# Patient Record
Sex: Female | Born: 1960 | Race: White | Hispanic: No | Marital: Married | Smoking: Never smoker
Health system: Southern US, Community
[De-identification: ages and names within clinical notes are randomized; demographics above are authoritative.]

---

## 2013-07-04 ENCOUNTER — Other Ambulatory Visit: Payer: Self-pay | Admitting: Neurosurgery

## 2013-07-04 DIAGNOSIS — M5416 Radiculopathy, lumbar region: Secondary | ICD-10-CM

## 2013-07-04 DIAGNOSIS — M5412 Radiculopathy, cervical region: Secondary | ICD-10-CM

## 2013-07-07 ENCOUNTER — Ambulatory Visit
Admission: RE | Admit: 2013-07-07 | Discharge: 2013-07-07 | Disposition: A | Discharge status: Home / Self Care | Payer: BC Managed Care – PPO | Source: Ambulatory Visit | Attending: Neurosurgery | Admitting: Neurosurgery

## 2013-07-07 VITALS — BP 117/76 | HR 62

## 2013-07-07 DIAGNOSIS — M5416 Radiculopathy, lumbar region: Secondary | ICD-10-CM

## 2013-07-07 DIAGNOSIS — M5412 Radiculopathy, cervical region: Secondary | ICD-10-CM

## 2013-07-07 MED ORDER — ONDANSETRON HCL 4 MG/2ML IJ SOLN
4.0000 mg | Freq: Once | INTRAMUSCULAR | Status: AC
  Administered 2013-07-07: 4 mg via INTRAMUSCULAR

## 2013-07-07 MED ORDER — ONDANSETRON HCL 4 MG/2ML IJ SOLN: 4.0000 mg | Freq: Four times a day (QID) | INTRAMUSCULAR | Status: DC | PRN

## 2013-07-07 MED ORDER — IOHEXOL 300 MG/ML  SOLN
9.0000 mL | Freq: Once | INTRAMUSCULAR | Status: AC | PRN
  Administered 2013-07-07: 9 mL via INTRATHECAL

## 2013-07-07 MED ORDER — DIAZEPAM 5 MG PO TABS
10.0000 mg | ORAL_TABLET | Freq: Once | ORAL | Status: AC
  Administered 2013-07-07: 10 mg via ORAL

## 2013-07-07 MED ORDER — MEPERIDINE HCL 100 MG/ML IJ SOLN
75.0000 mg | Freq: Once | INTRAMUSCULAR | Status: AC
  Administered 2013-07-07: 75 mg via INTRAMUSCULAR

## 2013-08-07 ENCOUNTER — Other Ambulatory Visit: Payer: Self-pay | Admitting: Physician Assistant

## 2013-08-07 DIAGNOSIS — E041 Nontoxic single thyroid nodule: Secondary | ICD-10-CM

## 2013-08-15 ENCOUNTER — Other Ambulatory Visit (HOSPITAL_COMMUNITY)
Admission: RE | Admit: 2013-08-15 | Discharge: 2013-08-15 | Disposition: A | Discharge status: Home / Self Care | Payer: 59 | Source: Ambulatory Visit | Attending: Interventional Radiology | Admitting: Interventional Radiology

## 2013-08-15 ENCOUNTER — Ambulatory Visit
Admission: RE | Admit: 2013-08-15 | Discharge: 2013-08-15 | Disposition: A | Discharge status: Home / Self Care | Payer: BC Managed Care – PPO | Source: Ambulatory Visit | Attending: Physician Assistant | Admitting: Physician Assistant

## 2013-08-15 ENCOUNTER — Ambulatory Visit
Admission: RE | Admit: 2013-08-15 | Discharge: 2013-08-15 | Disposition: A | Discharge status: Home / Self Care | Payer: 59 | Source: Ambulatory Visit | Attending: Physician Assistant | Admitting: Physician Assistant

## 2013-08-15 DIAGNOSIS — E041 Nontoxic single thyroid nodule: Secondary | ICD-10-CM | POA: Insufficient documentation

## 2015-11-13 IMAGING — CR DG MYELOGRAM 2+ REGIONS
13 of 20 series · 13 of 20 positions shown · non-contrast
Comparison: none

CLINICAL DATA: Spondylosis without myelopathy. Left leg pain. Right
shoulder and arm pain.
TECHNIQUE: Contiguous axial images were obtained through the Cervical and
Lumbar spine after the intrathecal infusion of infusion. Coronal and
sagittal reconstructions were obtained of the axial image sets.

[[hospital]]
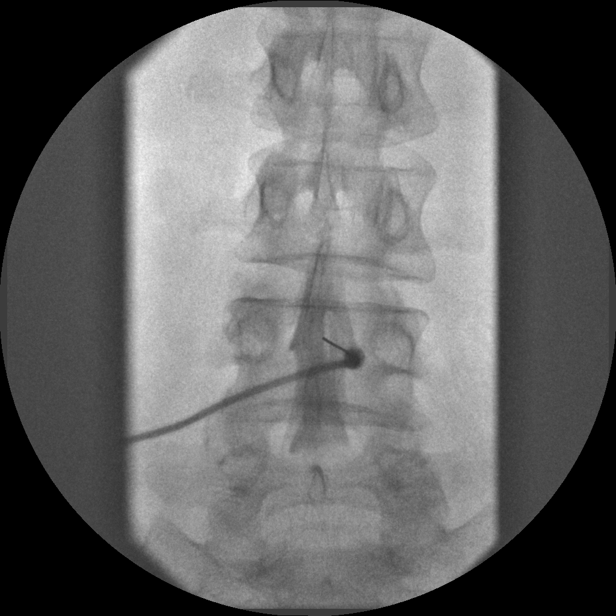

[myelogram  white (1 of 8)]
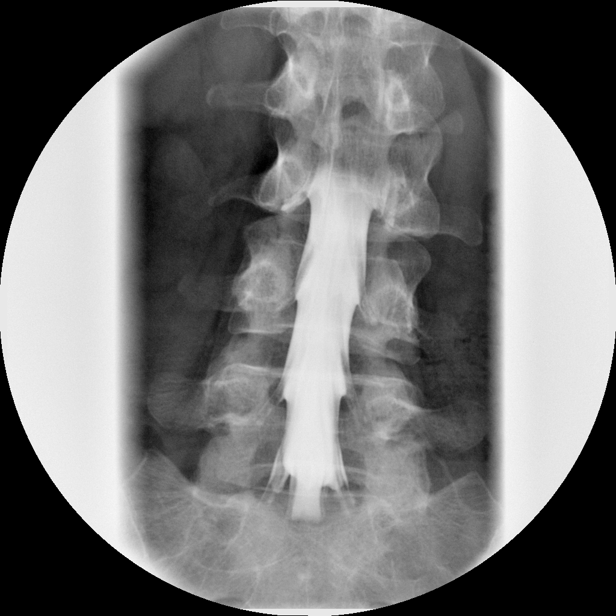

[myelogram  white (2 of 8)]
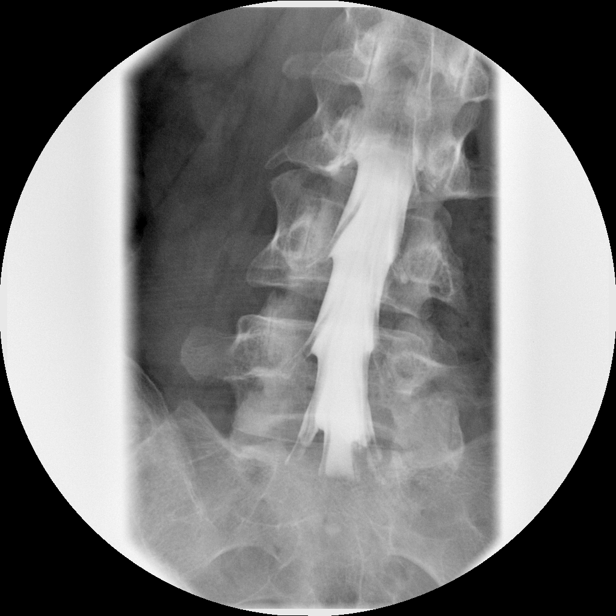

[myelogram  white (3 of 8)]
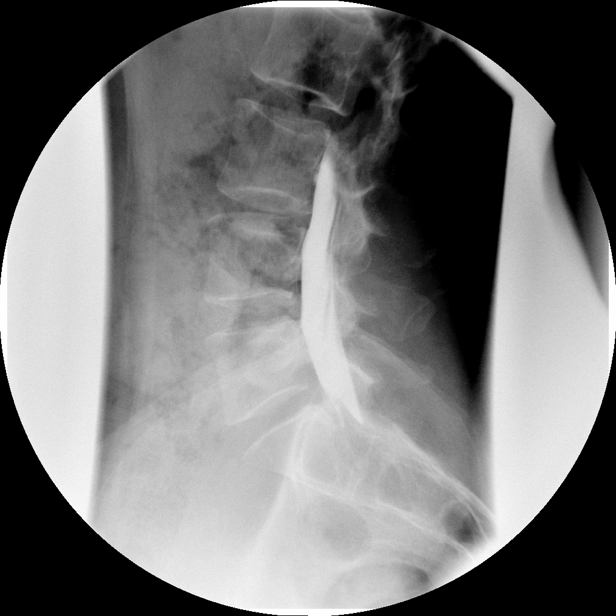

[myelogram  white (4 of 8)]
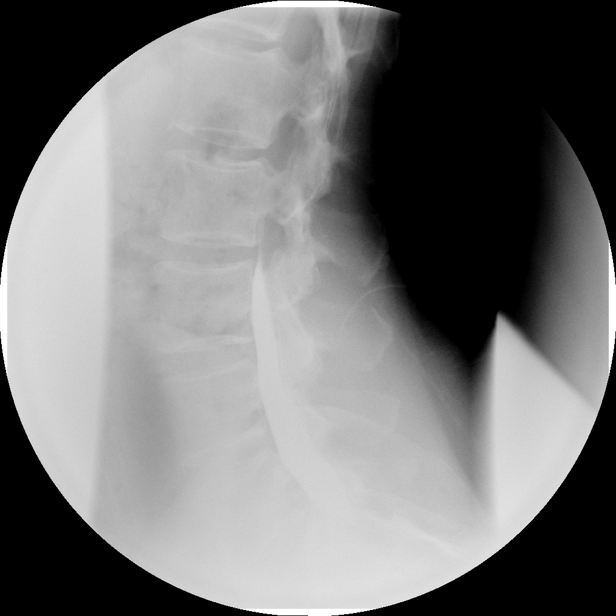

[myelogram  white (5 of 8)]
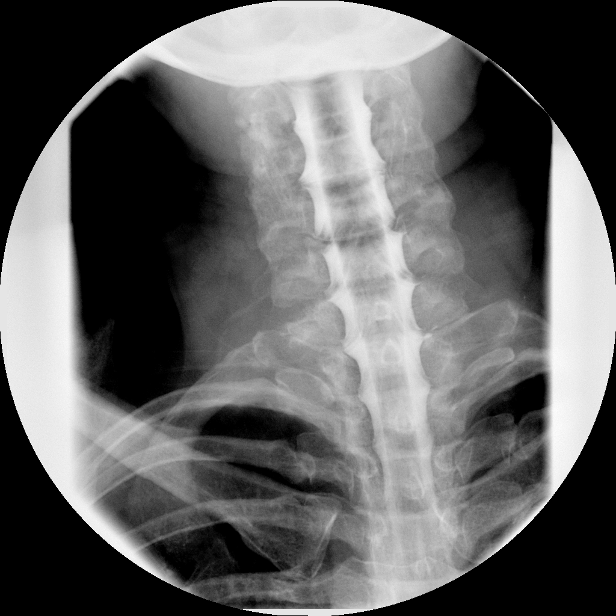

[myelogram  white (6 of 8)]
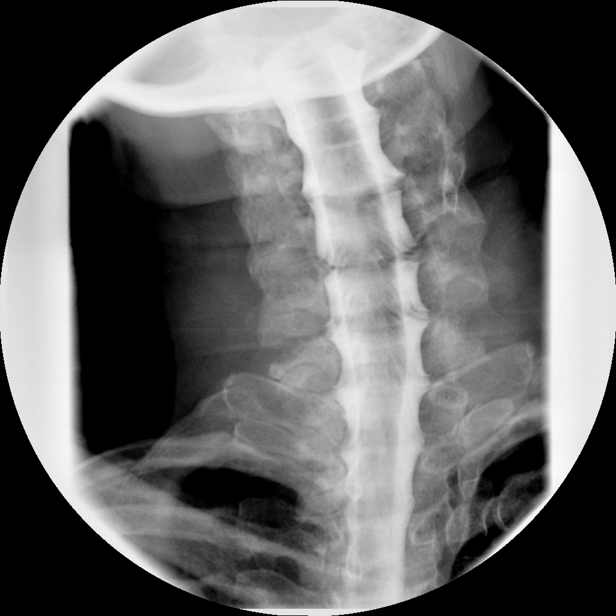

[myelogram  white (7 of 8)]
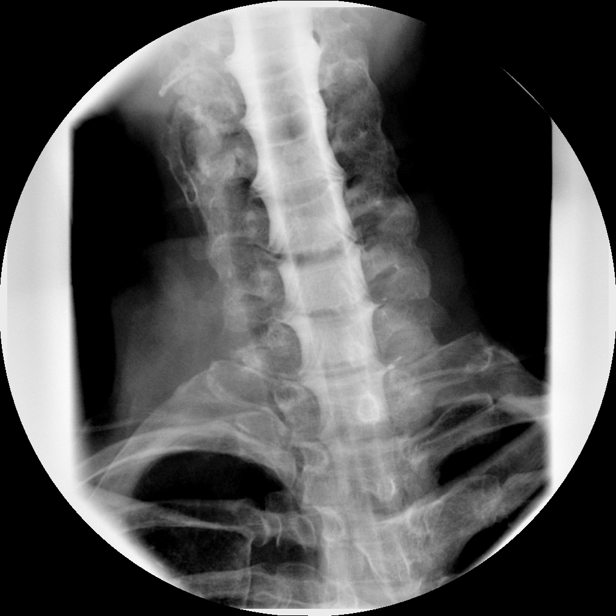

[myelogram  white (8 of 8)]
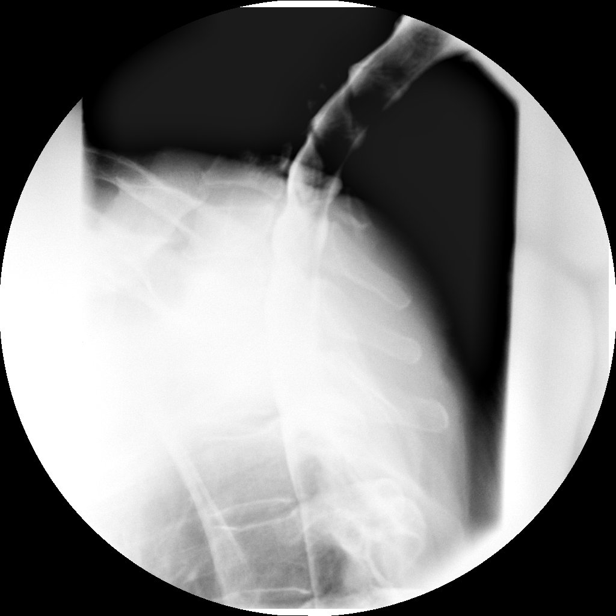

[view not recorded (1 of 4)]
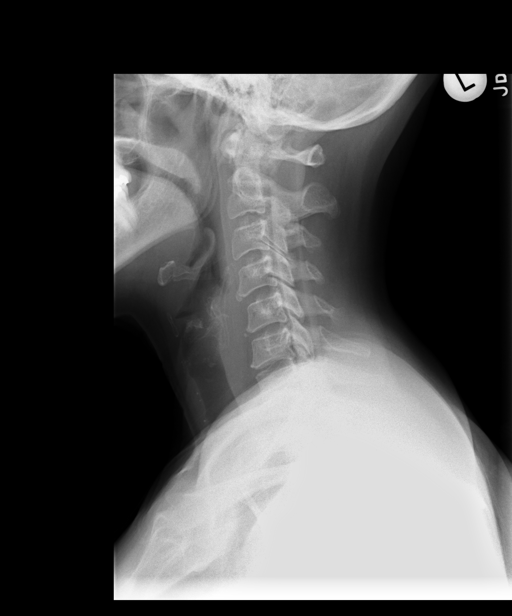

[view not recorded (2 of 4)]
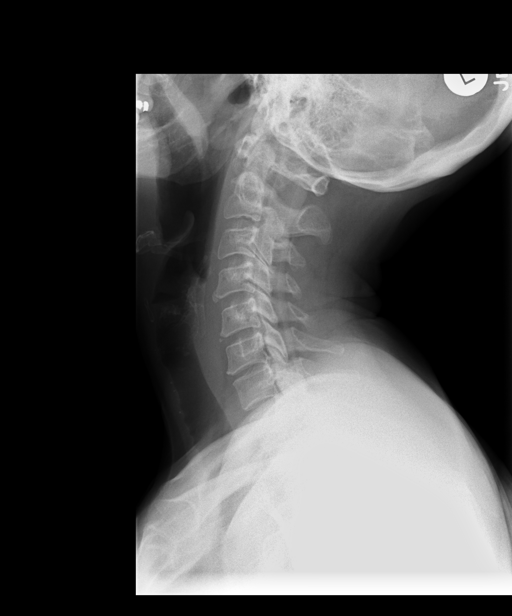

[view not recorded (3 of 4)]
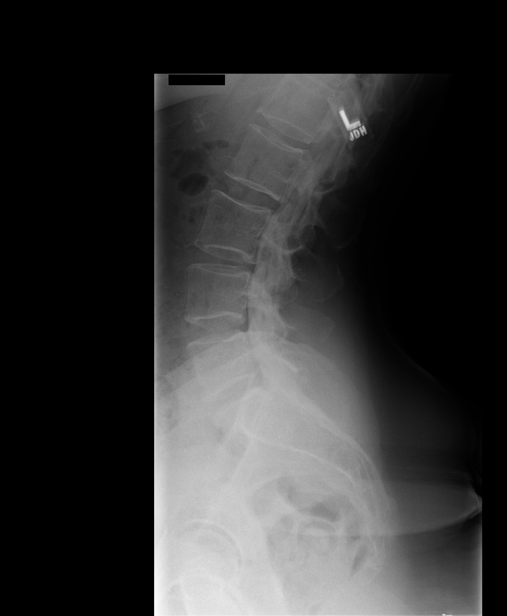

[view not recorded (4 of 4)]
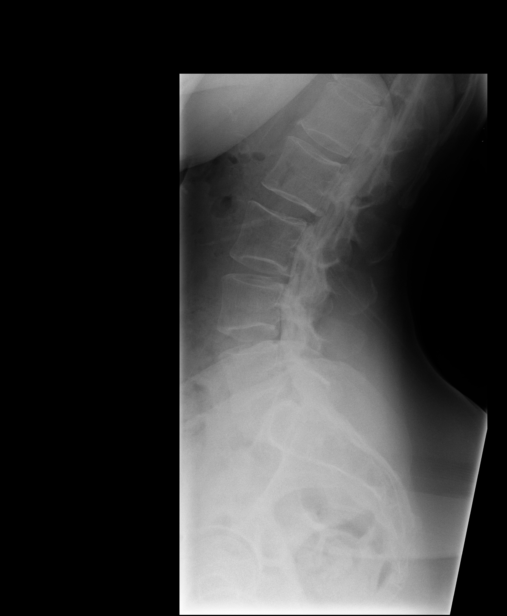

[13 of 20 positions shown; findings below may reference images not displayed]

FLUOROSCOPY TIME:  1 min 30 seconds

PROCEDURE:
LUMBAR PUNCTURE FOR CERVICAL AND LUMBAR MYELOGRAM

CERVICAL AND LUMBAR MYELOGRAM

CT CERVICAL MYELOGRAM

CT LUMBAR MYELOGRAM

After thorough discussion of risks and benefits of the procedure
including bleeding, infection, injury to nerves, blood vessels,
adjacent structures as well as headache and CSF leak, written and
oral informed consent was obtained. Consent was obtained by Dr. Telesca
Agenbag.

Patient was positioned prone on the fluoroscopy table. Local
anesthesia was provided with 1% lidocaine without epinephrine after
prepped and draped in the usual sterile fashion. Puncture was
performed at L3-4 using a 3 1/2 inch 22-gauge spinal needle via
right approach. Using a single pass through the dura, the needle was
placed within the thecal sac, with return of clear CSF. 9 cc of
Omnipaque-W11 was injected into the thecal sac, with normal
opacification of the nerve roots and cauda equina consistent with
free flow within the subarachnoid space. The patient was then moved
to the trendelenburg position and contrast flowed into the Cervical
spine region.

I personally performed the lumbar puncture and administered the
intrathecal contrast. I also personally performed acquisition of the
myelogram images.
FINDINGS: CERVICAL AND LUMBAR MYELOGRAM FINDINGS:

In the lumbar region, there is no spinal stenosis. There is no
abnormality of root sleeve filling. No extradural defects are seen.

In the cervical region, there is diminished filling of the C6 root
sleeves bilaterally.

Flexion extension views in the cervical spine do not show any
abnormal motion. In the lumbar spine, motion is likewise normal.

CT CERVICAL MYELOGRAM FINDINGS:

The foramen magnum is widely patent. C1-2 is normal. C2-3 shows
facet arthropathy on the left with mild foraminal narrowing on the
left. No central canal stenosis.

C3-4 shows mild bulging of the disc. There is facet arthropathy on
the left with mild foraminal narrowing on the left. No central canal
stenosis.

C4-5 shows mild bulging of the disc. No facet arthropathy. No
stenosis.

C5-6 shows bulging of the disc with bilateral uncovertebral
prominence. There is mild foraminal stenosis bilaterally.

C6-7 shows mild bulging of the disc. No canal or foraminal stenosis.

C7-T1 shows mild facet degeneration left worse than right. No canal
or foraminal stenosis.

At the lower pole the thyroid on the left, there is a 14 mm nodule
with at least 1 focus of calcification. Ultrasound would be
suggested for further evaluation.

CT LUMBAR MYELOGRAM FINDINGS:

T12-L1:  Normal.  Conus tip at lower L1.

L1-2: No disc pathology. Mild facet and ligamentous prominence on
the left without stenosis.

L2-3:  Normal interspace.

L3-4: No disc pathology. Mild facet degeneration bilaterally. No
stenosis.

L4-5: No disc pathology. Mild facet degeneration. No stenosis or
neural compression.

L5-S1: No disc pathology. Facet degeneration right more than left,
but no slippage or stenosis.

Sacroiliac joints appear normal.
IMPRESSION: In the lumbar region, there is no disc pathology, stenosis or focal
neural compression. Patient does have some facet degeneration in the
region which could be a cause of back pain.

In the cervical region, there is facet arthropathy on the left at
C2-3 and C3-4 and to a lesser extent at C7-T1. This could be
painful. At C5-6, there is bulging of the disc and uncovertebral
prominence bilaterally that results in mild foraminal narrowing
bilaterally.

14 mm thyroid nodule inferiorly on the left. As this appears to be
an isolated lesion, consider further evaluation with thyroid
ultrasound. If patient is clinically hyperthyroid, consider nuclear
medicine thyroid uptake and scan.
